# Patient Record
Sex: Male | Born: 1995 | Race: Black or African American | Hispanic: No | Marital: Single | Smoking: Never smoker
Health system: Southern US, Community
[De-identification: ages and names within clinical notes are randomized; demographics above are authoritative.]

---
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
## 2016-04-27 ENCOUNTER — Encounter: Payer: Self-pay | Admitting: Family

## 2016-04-27 ENCOUNTER — Ambulatory Visit (INDEPENDENT_AMBULATORY_CARE_PROVIDER_SITE_OTHER): Payer: 59 | Admitting: Family

## 2016-04-27 VITALS — BP 147/84 | HR 71 | Temp 98.0°F | Resp 16 | Ht 75.0 in | Wt 263.4 lb

## 2016-04-27 DIAGNOSIS — Z23 Encounter for immunization: Secondary | ICD-10-CM

## 2016-04-27 DIAGNOSIS — Z130 Encounter for screening for diseases of the blood and blood-forming organs and certain disorders involving the immune mechanism: Secondary | ICD-10-CM | POA: Diagnosis not present

## 2016-04-27 DIAGNOSIS — R030 Elevated blood-pressure reading, without diagnosis of hypertension: Secondary | ICD-10-CM | POA: Diagnosis not present

## 2016-04-27 NOTE — Addendum Note (Signed)
Addended by: Mervin KungFERGERSON, Amri Lien A on: 04/27/2016 02:59 PM   Modules accepted: Orders

## 2016-04-27 NOTE — Progress Notes (Signed)
   Subjective:    Patient ID: Tony Morgan, male    DOB: [birth year], 21 y.o.   MRN: [id number]  HPI  Tony Morgan is a 21 yr old african [place] male who presents today to discuss sickle cell screen.  He will be playing [place] [employer]. He denies personal or family hx of sickle cell disease or sickle cell trait.    Review of Systems See HPI  No past medical history on file.   Social History   Social History  . Marital status: Single    Spouse name: N/A  . Number of children: N/A  . Years of education: N/A   Occupational History  . Not on file.   Social History Main Topics  . Smoking status: Never Smoker  . Smokeless tobacco: Never Used  . Alcohol use No  . Drug use: No  . Sexual activity: Not on file   Other Topics Concern  . Not on file   Social History Narrative  . No narrative on file    No past surgical history on file.  Family History  Problem Relation Age of Onset  . Sudden death Neg Hx   . Heart attack Neg Hx     Allergies  Allergen Reactions  . Sulfonamide Derivatives Hives    No current outpatient prescriptions on file prior to visit.   No current facility-administered medications on file prior to visit.     BP (!) 147/84 (BP Location: Right Arm, Cuff Size: Large)   Pulse 71   Temp 98 F (36.7 C) (Oral)   Resp 16   Ht 6\' 3"  (1.905 m)   Wt 263 lb 6.4 oz (119.5 kg)   SpO2 100% Comment: room air  BMI 32.92 kg/m       Objective:   Physical Exam  Constitutional: He is oriented to person, place, and time. He appears well-developed and well-nourished. No distress.  HENT:  Head: Normocephalic and atraumatic.  Cardiovascular: Normal rate and regular rhythm.   No murmur heard. Pulmonary/Chest: Effort normal and breath sounds normal. No respiratory distress. He has no wheezes. He has no rales.  Musculoskeletal: He exhibits no edema.  Neurological: He is alert and oriented to person, place, and time.  Skin: Skin is warm and  dry.  Psychiatric: He has a normal mood and affect. His behavior is normal. Thought content normal.          Assessment & Plan:  Elevated blood pressure reading- New. Will have return in 2 weeks for BP recheck. Would like flu shot today.  BP Readings from Last 3 Encounters:  04/27/16 (!) 147/84  10/27/15 112/78  06/21/15 128/78   Screening for sickle cell trait- will order.

## 2016-04-27 NOTE — Progress Notes (Signed)
Pre visit review using our clinic review tool, if applicable. No additional management support is needed unless otherwise documented below in the visit note. 

## 2016-04-27 NOTE — Patient Instructions (Signed)
Please complete lab work prior to leaving.  Good luck at school!

## 2016-04-28 LAB — SICKLE CELL SCREEN: SICKLE CELL SCREEN: NEGATIVE

## 2016-04-30 DIAGNOSIS — Z025 Encounter for examination for participation in sport: Secondary | ICD-10-CM | POA: Diagnosis not present

## 2016-05-01 ENCOUNTER — Telehealth: Payer: Self-pay | Admitting: Family

## 2016-05-01 NOTE — Telephone Encounter (Signed)
Patient's mother called and would like to pick a copy of patient's sick cell results. She will also call back with a fax number.

## 2016-05-01 NOTE — Telephone Encounter (Signed)
Result faxed to below # and pt has been notified.

## 2016-05-01 NOTE — Telephone Encounter (Signed)
Patient's mother called back and request lab results be faxed to patient's school.  [employer]   Fax: [phone]

## 2016-06-01 ENCOUNTER — Encounter: Payer: Self-pay | Admitting: Family

## 2016-06-01 ENCOUNTER — Telehealth: Payer: Self-pay | Admitting: *Deleted

## 2016-06-01 NOTE — Telephone Encounter (Signed)
error:[id number] ° °

## 2016-06-01 NOTE — Telephone Encounter (Signed)
Spoke with pt's mom, advised her that we only have 3 immunizations on file for pt but no immunizations from birth / infancy. She states she already contacted pt's pediatrician (Dr Caron Presumeuben) and was told that they transferred pt's records in 2006. I advised her per chart review that we have no records prior to 2011. Advised her to call the pediatrician again to see if they have record of who pt's records were released to.

## 2016-09-11 ENCOUNTER — Encounter: Payer: Self-pay | Admitting: Family Medicine

## 2016-09-11 ENCOUNTER — Ambulatory Visit (INDEPENDENT_AMBULATORY_CARE_PROVIDER_SITE_OTHER): Payer: 59 | Admitting: Family Medicine

## 2016-09-11 VITALS — BP 130/80 | HR 76 | Temp 98.5°F | Resp 16 | Ht 75.0 in | Wt 257.2 lb

## 2016-09-11 DIAGNOSIS — S0502XA Injury of conjunctiva and corneal abrasion without foreign body, left eye, initial encounter: Secondary | ICD-10-CM | POA: Diagnosis not present

## 2016-09-11 MED ORDER — MOXIFLOXACIN HCL 0.5 % OP SOLN: 1.0000 [drp] | Freq: Three times a day (TID) | OPHTHALMIC | 0 refills | Status: DC

## 2016-09-11 NOTE — Progress Notes (Signed)
Patient ID: Tony Morgan, male   DOB: [birth year], 21 y.o.   MRN: [id number]     Subjective:  I acted as a Education administrator for Dr. Carollee Morgan.  Tony Morgan, Tony Morgan   Patient ID: Tony Morgan, male    DOB: [birth year], 21 y.o.   MRN: [id number]  Chief Complaint  Patient presents with  . Conjunctivitis    left eye    HPI  Patient is in today for conjunctivitis of left eye.  Symptoms started 3 nights ago.  Denies any itching or pain.  He states that it feels like its something in eye.  Does wear contacts.    Patient Care Team: Tony Alar, NP as PCP - General (Internal Medicine)   No past medical history on file.  No past surgical history on file.  Family History  Problem Relation Age of Onset  . Sudden death Neg Hx   . Heart attack Neg Hx     Social History   Social History  . Marital status: Single    Spouse name: N/A  . Number of children: N/A  . Years of education: N/A   Occupational History  . Not on file.   Social History Main Topics  . Smoking status: Never Smoker  . Smokeless tobacco: Never Used  . Alcohol use No  . Drug use: No  . Sexual activity: Not on file   Other Topics Concern  . Not on file   Social History Narrative  . No narrative on file    No outpatient prescriptions prior to visit.   No facility-administered medications prior to visit.     Allergies  Allergen Reactions  . Sulfonamide Derivatives Hives    Review of Systems  Constitutional: Negative for fever and malaise/fatigue.  HENT: Negative for congestion.           Eyes: Positive for redness. Negative for blurred vision, pain and discharge.  Respiratory: Negative for cough and shortness of breath.   Cardiovascular: Negative for chest pain, palpitations and leg swelling.  Gastrointestinal: Negative for vomiting.  Musculoskeletal: Negative for back pain.  Skin: Negative for rash.  Neurological: Negative for loss of consciousness and headaches.       Objective:      Physical Exam  Constitutional: He appears well-developed and well-nourished. No distress.  HENT:  Head: Normocephalic and atraumatic.  Eyes: Pupils are equal, round, and reactive to light. Lids are everted and swept, no foreign bodies found. Left eye exhibits discharge. Left eye exhibits no exudate and no hordeolum. No foreign body present in the left eye. Right conjunctiva is not injected. Right conjunctiva has no hemorrhage. Left conjunctiva is injected. Right eye exhibits normal extraocular motion and no nystagmus. Left eye exhibits normal extraocular motion and no nystagmus. Right pupil is round and reactive. Left pupil is round and reactive.    Neck: Normal range of motion. No thyromegaly present.  Cardiovascular: Normal rate and regular rhythm.   Pulmonary/Chest: Effort normal. He has no wheezes.  Abdominal: Soft. Bowel sounds are normal. There is no tenderness.  Musculoskeletal: Normal range of motion. He exhibits no edema or deformity.  Neurological: He is alert.  Skin: Skin is warm and dry. He is not diaphoretic.  Psychiatric: He has a normal mood and affect.  Nursing note and vitals reviewed.   BP 130/80 (BP Location: Left Arm, Cuff Size: Normal)   Pulse 76   Temp 98.5 F (36.9 C) (Oral)   Resp 16   Ht '6\' 3"'  (1.905 m)  Wt 257 lb 3.2 oz (116.7 kg)   SpO2 98%   BMI 32.15 kg/m  Wt Readings from Last 3 Encounters:  09/11/16 257 lb 3.2 oz (116.7 kg)  04/27/16 263 lb 6.4 oz (119.5 kg) (>99 %, Z= 2.59)*  10/27/15 247 lb (112 kg) (>99 %, Z= 2.37)*   * Growth percentiles are based on CDC 2-20 Years data.   BP Readings from Last 3 Encounters:  09/11/16 130/80  04/27/16 (!) 147/84  10/27/15 112/78     Immunization History  Administered Date(s) Administered  . Hepatitis B 15-Jun-1996  . Influenza,inj,Quad PF,36+ Mos 04/27/2016  . Td 01/13/2008    Health Maintenance  Topic Date Due  . HIV Screening  06/04/2011  . INFLUENZA VACCINE  11/21/2016  . TETANUS/TDAP   01/12/2018    No results found for: WBC, HGB, HCT, PLT, GLUCOSE, CHOL, TRIG, HDL, LDLDIRECT, LDLCALC, ALT, AST, NA, K, CL, CREATININE, BUN, CO2, TSH, PSA, INR, GLUF, HGBA1C, MICROALBUR  No results found for: TSH No results found for: WBC, HGB, HCT, MCV, PLT No results found for: NA, K, CHLORIDE, CO2, GLUCOSE, BUN, CREATININE, BILITOT, ALKPHOS, AST, ALT, PROT, ALBUMIN, CALCIUM, ANIONGAP, EGFR, GFR No results found for: CHOL No results found for: HDL No results found for: LDLCALC No results found for: TRIG No results found for: CHOLHDL No results found for: HGBA1C       Assessment & Plan:   Problem List Items Addressed This Visit    None    Visit Diagnoses    Abrasion of left cornea, initial encounter    -  Primary   Relevant Medications   moxifloxacin (VIGAMOX) 0.5 % ophthalmic solution    cool compresses If no improvement in 2-3 days see opth  I am having Tony Morgan start on moxifloxacin.  Meds ordered this encounter  Medications  . moxifloxacin (VIGAMOX) 0.5 % ophthalmic solution    Sig: Place 1 drop into the left eye 3 (three) times daily.    Dispense:  3 mL    Refill:  0    CMA served as scribe during this visit. History, Physical and Plan performed by medical provider. Documentation and orders reviewed and attested to.  Tony Held, DO

## 2016-09-11 NOTE — Patient Instructions (Signed)
Corneal Abrasion A corneal abrasion is a scratch or injury to the clear covering over the front of your eye (cornea). Your cornea forms a clear dome that protects your eye and helps to focus your vision. Your cornea is made up of many layers. The surface layer is a single layer of cells (corneal epithelium). It is one of the most sensitive tissues in your body. A corneal abrasion can be very painful. If a corneal abrasion is not treated, it can become infected and cause an ulcer. This can lead to scarring. A scarred cornea can affect your vision. Sometimes abrasions come back in the same area, even after the original injury has healed (recurrent erosion syndrome). What are the causes? This condition may be caused by:  A poke in the eye.  A gritty or irritating substance (foreign body) in the eye.  Excessive eye rubbing.  Very dry eyes.  Certain eye infections.  Contact lenses that fit poorly or are worn for a long period of time. You can also injure your cornea when putting contacts lenses in your eye or taking them out.  Eye surgery.  Sometimes, the cause is unknown. What are the signs or symptoms? Symptoms of this condition include:  Eye pain. The pain may get worse when your eye is open or when you move your eye.  A feeling of something stuck in your eye.  Having trouble keeping your eye open, or not being able to keep it open.  Tearing and redness.  Sensitivity to light.  Blurred vision.  Headache.  How is this diagnosed? This condition may be diagnosed based on:  Your medical history.  Your symptoms.  An eye exam. You may work with a health care provider who specializes in diseases and conditions of the eye (ophthalmologist). Before the eye exam, numbing drops may be put into your eye. You may also have dye put in your eye with a dropper or a small paper strip. The dye makes the abrasion easy to see when your ophthalmologist examines your eye with a light. Your  ophthalmologist may look at your eye through an eye scope (slit lamp).  How is this treated? Treatment may vary depending on the cause of your condition, and it may include:  Washing out your eye.  Removing any foreign body.  Antibiotic drops or ointment to treat an infection.  Steroid drops or ointment to treat redness, irritation, or inflammation.  Pain medicine.  An eye patch to keep your eye closed.  Follow these instructions at home: Medicines  Use eye drops or ointments as told by your eye care provider.  If you were prescribed antibiotic drops or ointment, use them as told by your eye care provider. Do not stop using the antibiotic even if you start to feel better.  Take over-the-counter and prescription medicines only as told by your eye care provider.  Do not drive or use heavy machinery while taking prescription pain medicine. General instructions  If you have an eye patch, wear it as told by your eye care provider. ? Do not drive or use machinery while wearing an eye patch. Your ability to judge distances will be impaired. ? Follow instructions from your eye care provider about when to remove the patch.  Ask your eye care provider whether you can use a cold, wet cloth (compress) on your eye to relieve pain.  Do not rub or touch your eye. Do not wash out your eye.  Do not wear contact lenses until   your eye care provider says that this is okay.  Avoid bright light and eye strain.  Keep all follow-up visits as told by your eye care provider. This is important for preventing infection and vision loss. Contact a health care provider if:  You continue to have eye pain and other symptoms for more than 2 days.  You develop new symptoms, such as redness, tearing, or discharge.  You have discharge that makes your eyelids stick together in the morning.  Your eye patch becomes so loose that you can blink your eye.  Symptoms return after the original abrasion has  healed. Get help right away if:  You have severe eye pain that does not get better with medicine.  You have vision loss. Summary  A corneal abrasion is a scratch on the outer layer of the clear covering over the front of your eye (cornea).  Corneal abrasion can cause eye pain, redness, tearing, and blurred vision.  This condition is usually treated with medicine to prevent infection and scarring. You also may have to wear an eye patch to cover your eye.  Let your eye care provider know if your symptoms continue for more than 2 days. This information is not intended to replace advice given to you by your health care provider. Make sure you discuss any questions you have with your health care provider. Document Released: 04/06/2000 Document Revised: 03/20/2016 Document Reviewed: 03/20/2016 Elsevier Interactive Patient Education  2017 [employer] Inc.  

## 2016-11-07 ENCOUNTER — Ambulatory Visit (INDEPENDENT_AMBULATORY_CARE_PROVIDER_SITE_OTHER): Payer: 59 | Admitting: Family

## 2016-11-07 ENCOUNTER — Encounter: Payer: Self-pay | Admitting: Family

## 2016-11-07 VITALS — BP 123/76 | HR 105 | Temp 100.0°F | Resp 18 | Ht 75.0 in | Wt 238.8 lb

## 2016-11-07 DIAGNOSIS — J029 Acute pharyngitis, unspecified: Secondary | ICD-10-CM

## 2016-11-07 LAB — POCT RAPID STREP A (OFFICE): RAPID STREP A SCREEN: NEGATIVE

## 2016-11-07 NOTE — Patient Instructions (Signed)
For sore throat you may use cepacol lozenges as needed and ibuprofen 400mg  every 6 hours for the next few days.  Call if new/worsening symptoms or if symptoms are not improved in 3 days.   Pharyngitis Pharyngitis is a sore throat (pharynx). There is redness, pain, and swelling of your throat. Follow these instructions at home:  Drink enough fluids to keep your pee (urine) clear or pale yellow.  Only take medicine as told by your doctor. ? You may get sick again if you do not take medicine as told. Finish your medicines, even if you start to feel better. ? Do not take aspirin.  Rest.  Rinse your mouth (gargle) with salt water ( tsp of salt per 1 qt of water) every 1-2 hours. This will help the pain.  If you are not at risk for choking, you can suck on hard candy or sore throat lozenges. Contact a doctor if:  You have large, tender lumps on your neck.  You have a rash.  You cough up green, yellow-brown, or bloody spit. Get help right away if:  You have a stiff neck.  You drool or cannot swallow liquids.  You throw up (vomit) or are not able to keep medicine or liquids down.  You have very bad pain that does not go away with medicine.  You have problems breathing (not from a stuffy nose). This information is not intended to replace advice given to you by your health care provider. Make sure you discuss any questions you have with your health care provider. Document Released: 09/26/2007 Document Revised: 09/15/2015 Document Reviewed: 12/15/2012 Elsevier Interactive Patient Education  2017 [employer]

## 2016-11-07 NOTE — Progress Notes (Signed)
   Subjective:    Patient ID: Tony Morgan, male    DOB: [birth year], 21 y.o.   MRN: [id number]  HPI  Mr. Tony Morgan is a 21 year old male who presents today with chief complaint of sore throat.  Reports that he has had a sore throat and chills x 3 days. Reports sore throat is 9.9/10. Has used some chloraseptic spray without much improvement. Has had sweat/fever, chills. Denies sick contacts.  Review of Systems See HPI  No past medical history on file.   Social History   Social History  . Marital status: Single    Spouse name: N/A  . Number of children: N/A  . Years of education: N/A   Occupational History  . Not on file.   Social History Main Topics  . Smoking status: Never Smoker  . Smokeless tobacco: Never Used  . Alcohol use No  . Drug use: No  . Sexual activity: Not on file   Other Topics Concern  . Not on file   Social History Narrative  . No narrative on file    No past surgical history on file.  Family History  Problem Relation Age of Onset  . Sudden death Neg Hx   . Heart attack Neg Hx     Allergies  Allergen Reactions  . Sulfonamide Derivatives Hives    No current outpatient prescriptions on file prior to visit.   No current facility-administered medications on file prior to visit.     BP 123/76 (BP Location: Right Arm, Cuff Size: Large)   Pulse (!) 105   Temp 100 F (37.8 C) (Oral)   Resp 18   Ht 6\' 3"  (1.905 m)   Wt 238 lb 12.8 oz (108.3 kg)   SpO2 100%   BMI 29.85 kg/m       Objective:   Physical Exam  Constitutional: He is oriented to person, place, and time. He appears well-developed and well-nourished. No distress.  HENT:  Head: Normocephalic and atraumatic.  Right Ear: Tympanic membrane and ear canal normal.  Left Ear: Tympanic membrane and ear canal normal.  Mouth/Throat: Posterior oropharyngeal erythema present. No oropharyngeal exudate or posterior oropharyngeal edema.  2-3+ tonsils bilaterally  Cardiovascular: Normal  rate and regular rhythm.   No murmur heard. Pulmonary/Chest: Effort normal and breath sounds normal. No respiratory distress. He has no wheezes. He has no rales.  Musculoskeletal: He exhibits no edema.  Lymphadenopathy:    He has cervical adenopathy.  Neurological: He is alert and oriented to person, place, and time.  Skin: Skin is warm and dry.  Psychiatric: He has a normal mood and affect. His behavior is normal. Thought content normal.          Assessment & Plan:  Viral pharyngitis- rapid strep negative. Will send strep probe to lab to confirm. Advised pt as follows:  For sore throat you may use cepacol lozenges as needed and ibuprofen 400mg  every 6 hours for the next few days.  Call if new/worsening symptoms or if symptoms are not improved in 3 days.

## 2016-11-08 LAB — STREP A DNA PROBE: GASP: NOT DETECTED

## 2016-11-12 ENCOUNTER — Encounter: Payer: Self-pay | Admitting: Family
